# Patient Record
Sex: Male | Born: 1980 | Race: Black or African American | Hispanic: No | Marital: Single | Smoking: Current every day smoker
Health system: Southern US, Community
[De-identification: ages and names within clinical notes are randomized; demographics above are authoritative.]

## PROBLEM LIST (undated history)

## (undated) DIAGNOSIS — G43909 Migraine, unspecified, not intractable, without status migrainosus: Secondary | ICD-10-CM

## (undated) DIAGNOSIS — J939 Pneumothorax, unspecified: Secondary | ICD-10-CM

---

## 2005-10-31 ENCOUNTER — Emergency Department (HOSPITAL_COMMUNITY): Admission: EM | Admit: 2005-10-31 | Discharge: 2005-11-01 | Payer: Self-pay | Admitting: Emergency Medicine

## 2007-02-07 ENCOUNTER — Emergency Department (HOSPITAL_COMMUNITY): Admission: EM | Admit: 2007-02-07 | Discharge: 2007-02-07 | Payer: Self-pay | Admitting: Emergency Medicine

## 2008-05-20 ENCOUNTER — Inpatient Hospital Stay (HOSPITAL_COMMUNITY): Admission: EM | Admit: 2008-05-20 | Discharge: 2008-05-29 | Payer: Self-pay | Admitting: Emergency Medicine

## 2008-05-23 ENCOUNTER — Ambulatory Visit: Payer: Self-pay | Admitting: Thoracic Surgery

## 2008-05-26 ENCOUNTER — Encounter: Payer: Self-pay | Admitting: Thoracic Surgery

## 2008-06-04 ENCOUNTER — Ambulatory Visit: Payer: Self-pay | Admitting: Thoracic Surgery

## 2008-06-04 ENCOUNTER — Encounter: Admission: RE | Admit: 2008-06-04 | Discharge: 2008-06-04 | Payer: Self-pay | Admitting: Thoracic Surgery

## 2008-06-25 ENCOUNTER — Ambulatory Visit: Payer: Self-pay | Admitting: Thoracic Surgery

## 2008-06-25 ENCOUNTER — Encounter: Admission: RE | Admit: 2008-06-25 | Discharge: 2008-06-25 | Payer: Self-pay | Admitting: Thoracic Surgery

## 2008-09-21 ENCOUNTER — Emergency Department (HOSPITAL_COMMUNITY): Admission: EM | Admit: 2008-09-21 | Discharge: 2008-09-21 | Payer: Self-pay | Admitting: Emergency Medicine

## 2009-07-24 IMAGING — CR DG CHEST 1V PORT
1 series · 1 of 1 positions shown · non-contrast
Comparison: 05/26/2008

CLINICAL DATA: Evaluate right pneumothorax after chest tube
placement

PORTABLE CHEST - 1 VIEW

[view not recorded]
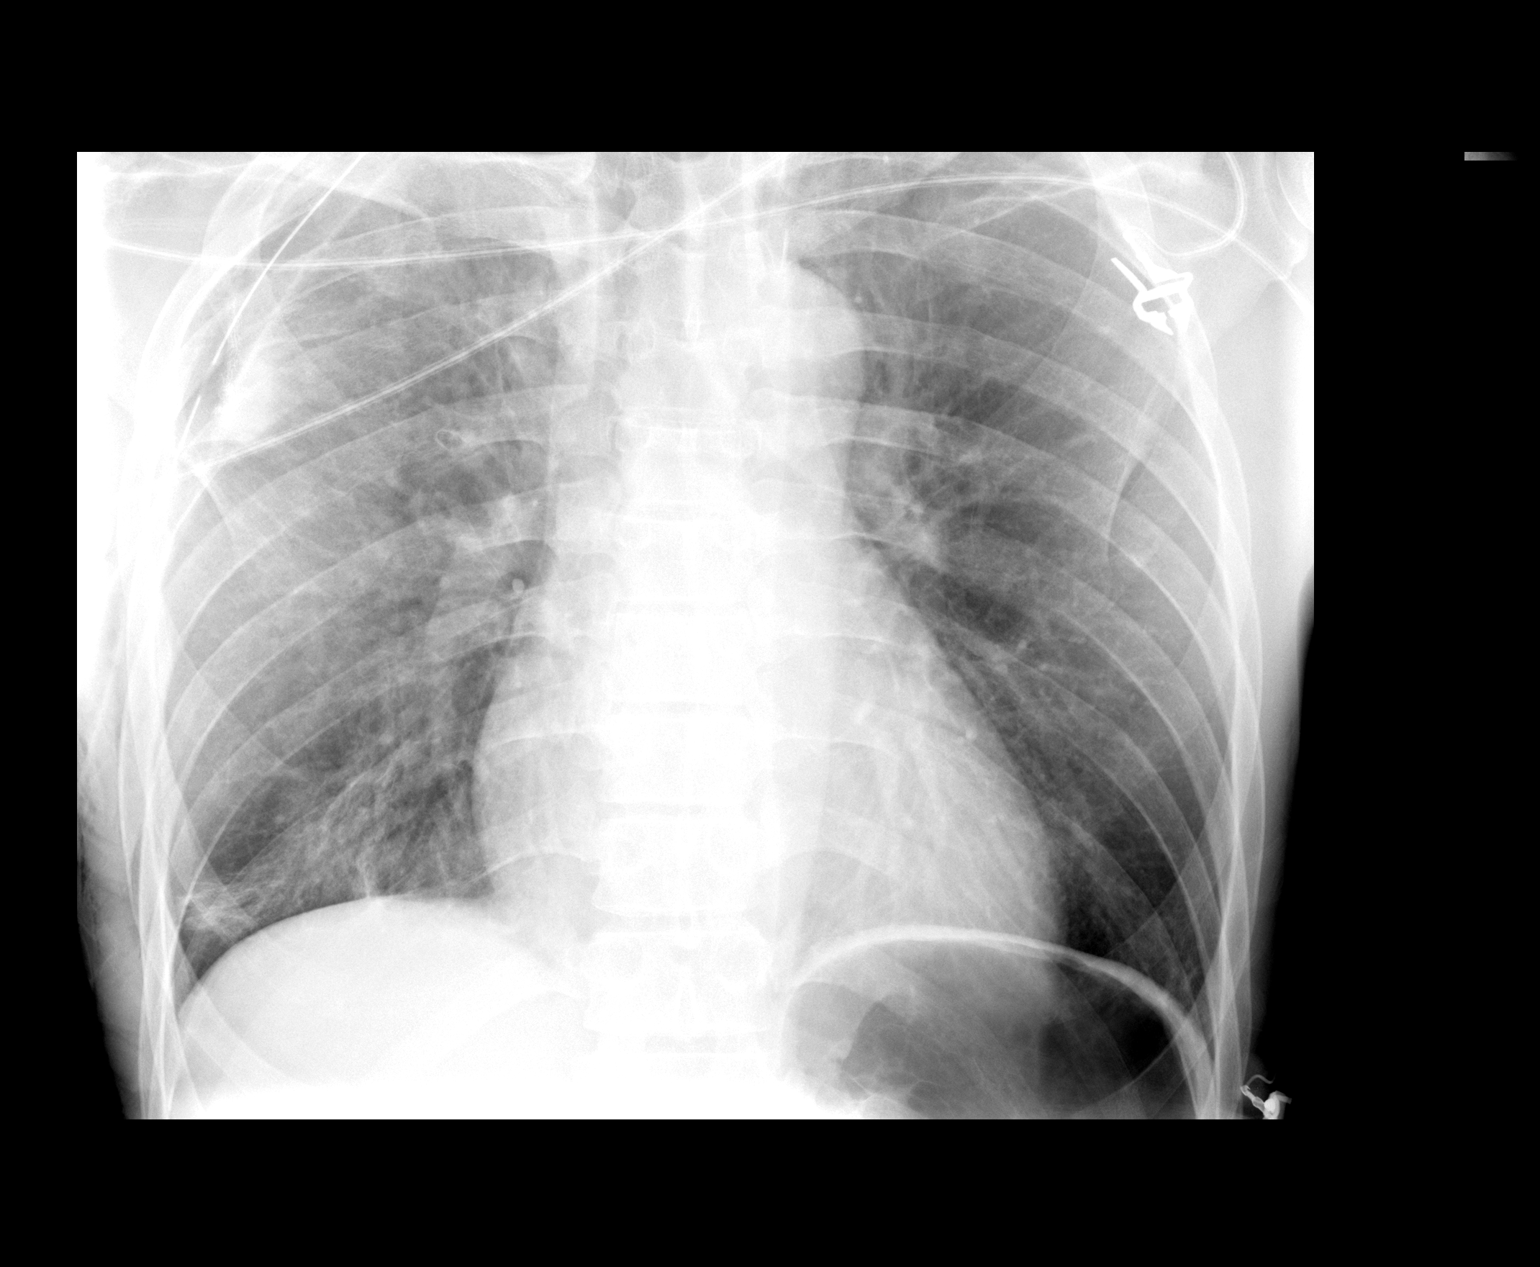

[1 of 1 positions shown; findings below may reference images not displayed]

FINDINGS: There is an significant improvement in the right
pneumothorax after chest tube placement.

Right pneumothorax now measures approximately 20%.

Heart size is normal.

There is no pleural effusion.

Atelectasis is identified at the right lung base..
IMPRESSION: 1.  Significant re-expansion of right lung.  Approximate 20% right
upper lobe pneumothorax remains after chest tube placement

## 2009-07-25 IMAGING — CR DG CHEST 1V PORT
1 series · 1 of 1 positions shown · non-contrast
Comparison: 05/26/2008

CLINICAL DATA: Postoperative status post video assisted
thoracoscopic surgery, pneumothorax

PORTABLE CHEST - 1 VIEW

[view not recorded]
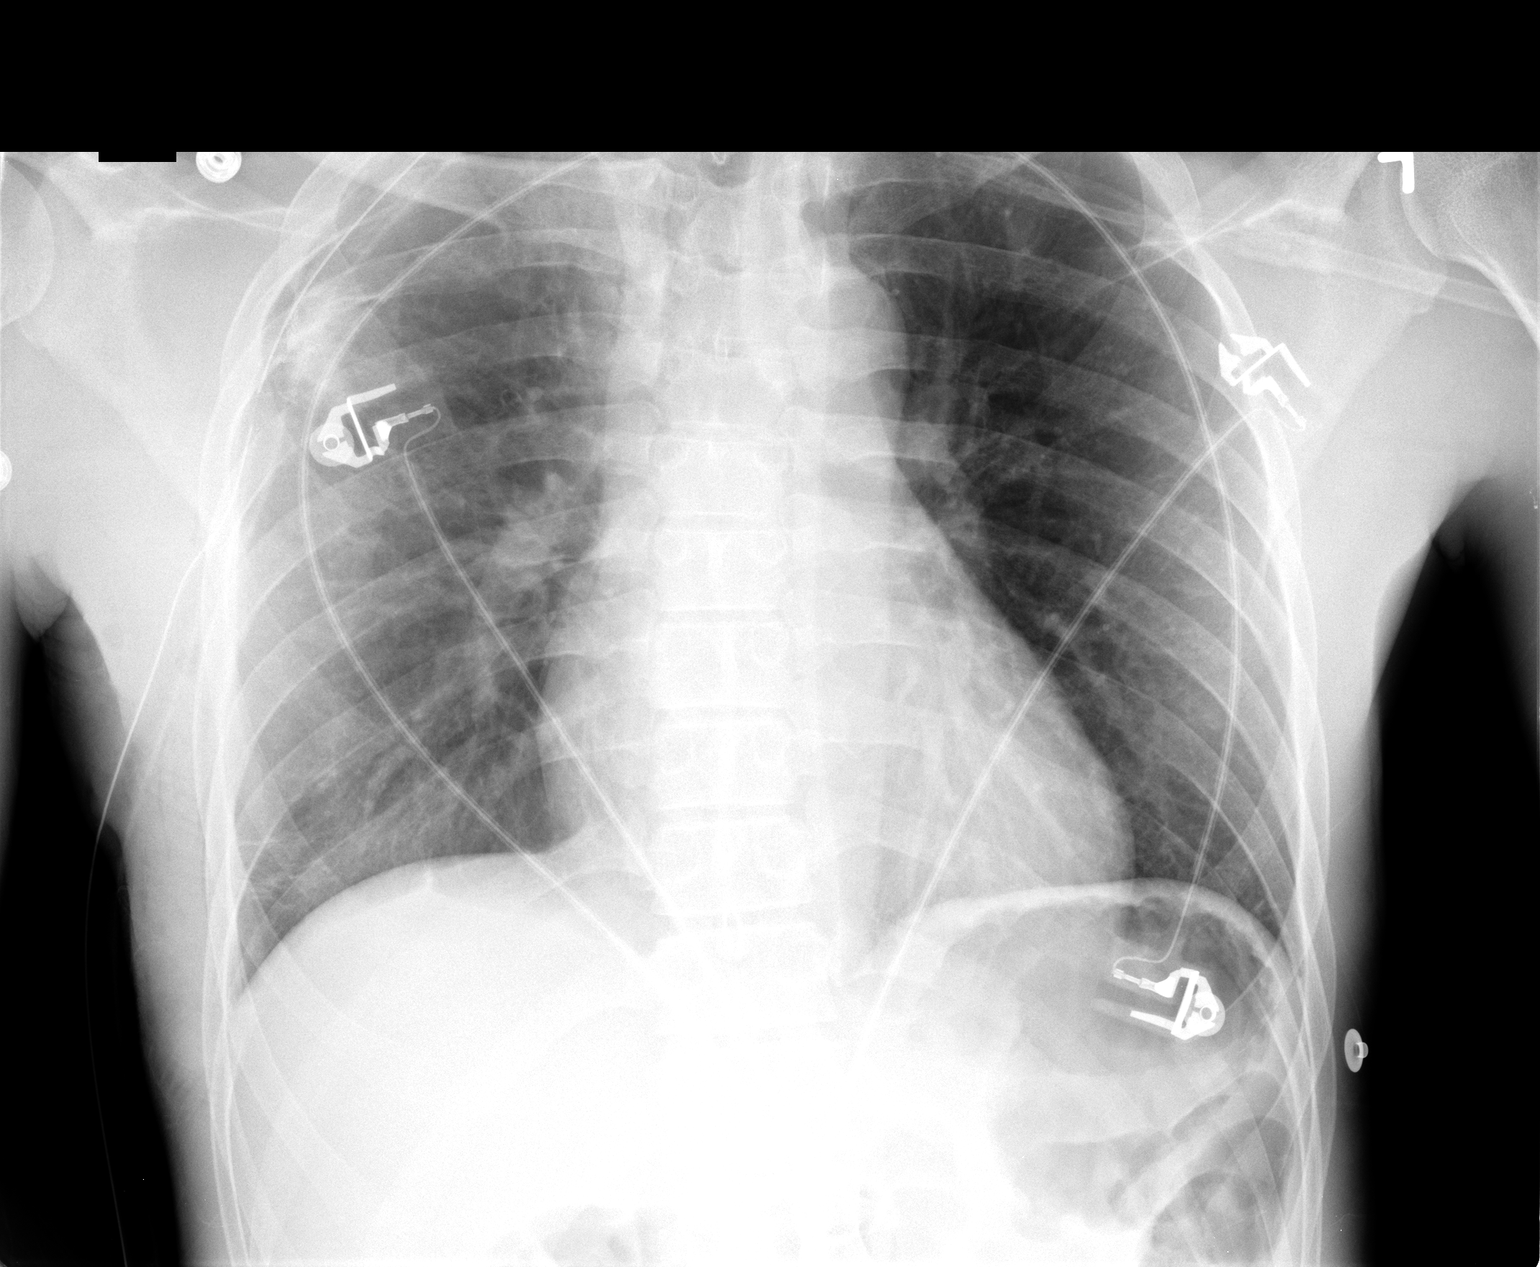

[1 of 1 positions shown; findings below may reference images not displayed]

FINDINGS: Near complete resolution of now trace right apical
pneumothorax noted with right-sided chest tube remaining in place.
Right apical sutures noted.  No mediastinal shift.  Heart size
normal.  Cardiac leads overlie the chest.
IMPRESSION: Continued decrease in now trace right apical pneumothorax with
chest tube in place.

## 2009-07-27 IMAGING — CR DG CHEST 2V
2 series · 2 of 2 positions shown · non-contrast
Comparison: Portable chest of 05/28/2008

CLINICAL DATA: Lung surgery, chest soreness, follow-up

CHEST - 2 VIEW

[w chest pa]
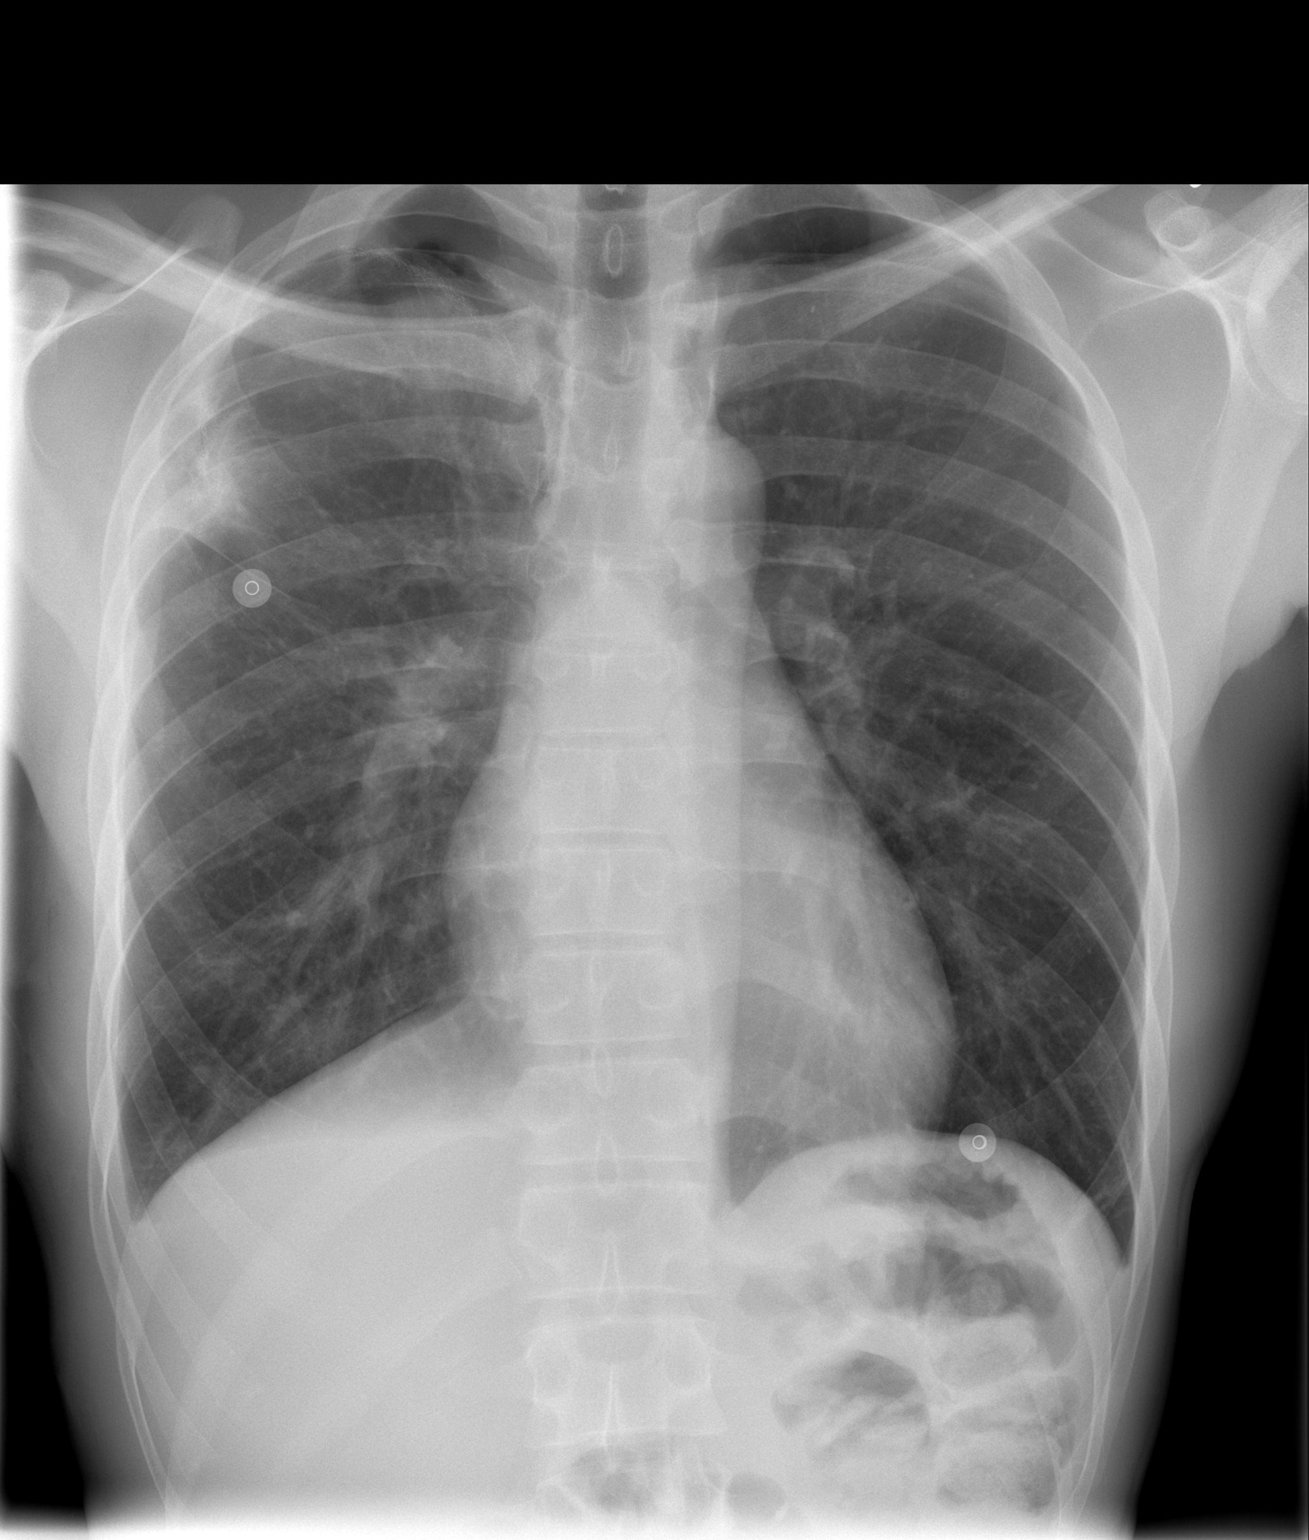

[w chest lat]
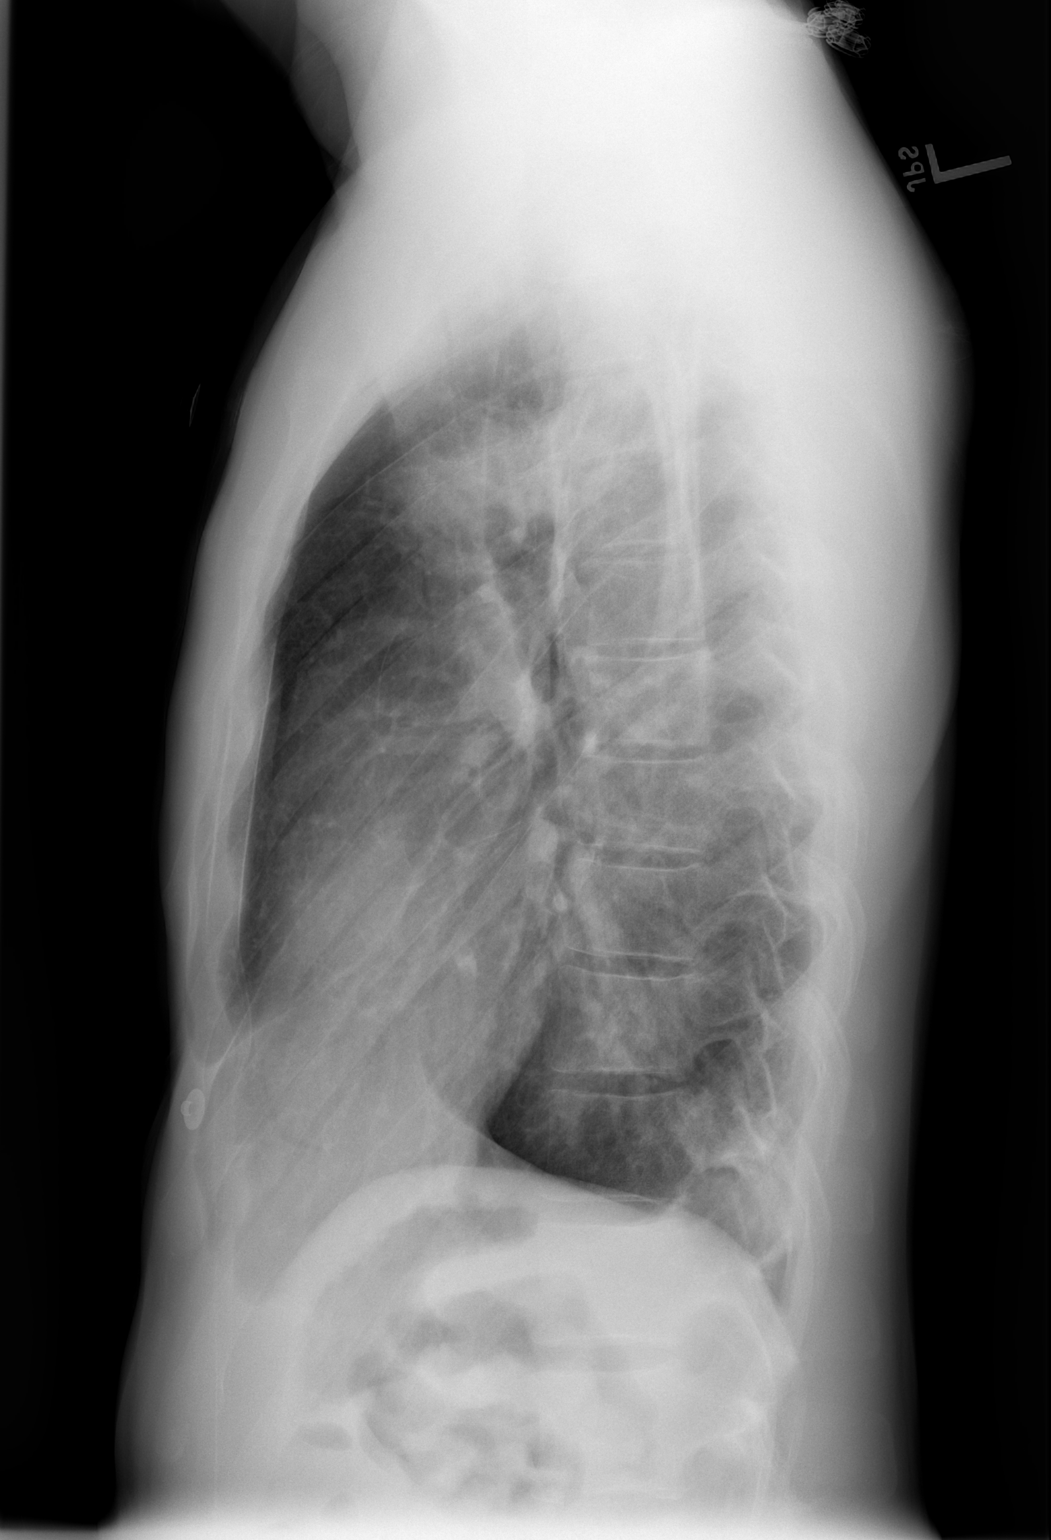

[2 of 2 positions shown; findings below may reference images not displayed]

FINDINGS: An there is little change in the small right apical
pneumothorax with pleural sutures noted in the right lung apex and
in the periphery of the right upper lobe.  Otherwise the lungs are
clear.  There does appear to be a small right pleural effusion
present.  Heart size is stable.  No bony abnormality is seen.
IMPRESSION: Stable small right apical pneumothorax with postop change in the
right upper lobe.  Small right pleural effusion.

## 2010-08-29 ENCOUNTER — Encounter: Payer: Self-pay | Admitting: Thoracic Surgery

## 2010-12-21 NOTE — Op Note (Signed)
NAME:  Phillip, [name] NO.:  [id number]   MEDICAL RECORD NO.:  [id number]          PATIENT TYPE:  INP   LOCATION:  3304                         FACILITY:  [facility]   PHYSICIAN:  Ines Bloomer, M.D. DATE OF BIRTH:  [birth year]   DATE OF PROCEDURE:  05/26/2008  DATE OF DISCHARGE:  05/29/2008                               OPERATIVE REPORT   PREOPERATIVE DIAGNOSIS:  Right pneumothorax secondary to apical blebs  with persistent air leak.   POSTOPERATIVE DIAGNOSIS:  Right pneumothorax secondary to apical blebs  with persistent air leak.   OPERATION PERFORMED:  Right video-assisted thoracoscopic surgery,  resection of apical blebs for likely pleurodesis.   SURGEON:  Ines Bloomer, MD   FIRST ASSISTANT:  Theda Belfast, PA-C   ANESTHESIA:  General anesthesia.   PROCEDURE:  After percutaneous insertion of all monitor lines, the  patient underwent general anesthesia, was turned to the right lateral  thoracotomy position, prepped and draped in the usual sterile manner.  Dual-lumen tube was inserted.  The right lung was deflated.  Two trocar  sites were made in the anterior and posterior axillary lines at the  fifth intercostal space, two trocars were inserted.  Trocar site was  made in the mid axillary line at the third intercostal space.  The  pictures were taken and we could see apical blebs, very large apical  blebs at the top of the apex.  There was a small bleb on top of the  apex.  Using this, we came in with a Kaiser ring forceps from the  superior trocar site and grasped the blebs and then coming in from the  posterior trocar site using a Covidien 45-mm green stapler.  We resected  the bleb with 2 applications, then we reexamined that there was another  smaller one more anteriorly and this was grasped in like manner, was  resected and removed, as well as blebs were removed.  We checked for air  leak and none was found.  We then removed the pleura by  using the [facility]  ring forceps over the first 5 rib strip in the pleura and then using a  Bovie scraper to causing mechanical pleurodesis of the lower ribs down  to the diaphragm and 1 chest tube was brought into the anterior trocar  site, the other 2 trocar site were closed with 3-0 Vicryl and Dermabond  for the skin.  The lung was reexpanded.  A Marcaine block was then usual  fashion with single On-Q was inserted in the usual fashion.  The chest  tube was sutured in place with a 0 silk.      Ines Bloomer, M.D.  Electronically Signed     DPB/MEDQ  D:  06/28/2008  T:  06/28/2008  Job:  [id number]

## 2010-12-21 NOTE — H&P (Signed)
NAME:  Phillip [name], Phillip [name] NO.:  [id number]   MEDICAL RECORD NO.:  [id number]          PATIENT TYPE:  EMS   LOCATION:  ED                            FACILITY:  [facility]   PHYSICIAN:  Dalia Heading, M.D.  DATE OF BIRTH:  1981/06/07   DATE OF ADMISSION:  05/20/2008  DATE OF DISCHARGE:  LH                              HISTORY & PHYSICAL   CHIEF COMPLAINT:  Spontaneous right pneumothorax.   HISTORY OF PRESENT ILLNESS:  The patient is a 30 year old black male who  presented to the emergency room with 3-4 hour history of worsening right-  sided chest pain and shortness of breath.  Chest x-ray was performed  which revealed a complete right pneumothorax.  Surgery consultation was  obtained and the patient was evaluated in the emergency room.   PAST MEDICAL HISTORY:  Unremarkable.   PAST SURGICAL HISTORY:  Unremarkable.   CURRENT MEDICATIONS:  None.   ALLERGIES:  No known drug allergies.   REVIEW OF SYSTEMS:  The patient does smoke tobacco.  Denies any history  of having trauma or pneumothorax in the past.   PHYSICAL EXAMINATION:  GENERAL:  The patient is a pleasant black male in  no acute distress.  He is afebrile.  VITAL SIGNS:  Stable.  LUNGS:  Decreased breath sounds are heard on the right.  He is clear to  auscultation on the left side.  There does not appear to be a  significant tracheal deviation present.  HEART:  Reveals regular rate and rhythm without S3, S4, or murmurs.  ABDOMEN:  Unremarkable.   PROCEDURE NOTE:  The patient was placed left lateral decubitus position.  A informed consent was obtained from the patient.  Surgical site  confirmation was performed.  The right mid axillary line was prepped and  draped using the usual sterile technique with ChloraPrep, 1% Xylocaine  was used for local anesthesia.  A small-caliber Heimlich chest tube was  then placed at approximately the fifth intercostal space without  difficulty.  This was connected to a  __________ at negative 20-cm  suction.  A follow up chest x-ray was performed which revealed complete  resolution of the pneumothorax and adequate position of the chest tube.  The patient tolerated the procedure well.  His oxygen saturation at the  end of the procedure was 100%.   IMPRESSION:  Spontaneous right pneumothorax.   PLAN:  The patient be admitted to hospital for further management of his  chest tube.  He has been made aware that he will be in the hospital for  approximately 2-3 days.  There is a small air leak present, thus this  precludes him being discharged home.      Dalia Heading, M.D.  Electronically Signed     MAJ/MEDQ  D:  05/20/2008  T:  05/21/2008  Job:  [id number]

## 2010-12-21 NOTE — Assessment & Plan Note (Signed)
OFFICE VISIT   SHENOUDA, GENOVA  DOB:  Jun 14, 1981                                        June 04, 2008  CHART #:  [id number]   HISTORY OF PRESENTING ILLNESS:  This is a 30 year old African American  male who originally presented to [facility] with a 3 or 4-hour  history of worsening right-sided chest pain and shortness of breath.  A  chest x-ray done showed a complete spontaenous right pneumothorax.  A  chest tube was placed with full resolution of right pneumothorax.  He  was then admitted to [facility] for further chest tube management;  however he developed an air leak and the subsequent 5%-10% right apical  pneumothorax.  He was transferred from [facility] to [facility] where he  underwent a right VATS, apical bleb resection of the right upper lobe  and pleurectomy by Dr. Edwyna Shell on May 26, 2008.  Since having been  discharged from the hospital on May 29, 2008, he notes some numbness  on the right side in right anterior chest and occasional fatigue.   PHYSICAL EXAMINATION:  GENERAL:  This is a pleasant 30 year old [place]  American male who is in no acute distress.  He is alert and cooperative.  VITAL SIGNS:  Blood pressure 131/81, heart rate 97, respiratory rate 18,  and O2 sat 99% on room air.  CARDIOVASCULAR:  Regular rate and rhythm.  LUNGS:  Clear to auscultation bilaterally.  No rales, wheezes, or  rhonchi.  EXTREMITIES:  No cyanosis, clubbing, or edema.   Right chest wound clean and dry, 2 sutures removed, couple of Steri-  Strips placed.  On chest x-ray Strips placed.   Chest x-ray done today showed tiny right apical pneumothorax somewhat  decreased from previous chest x-ray on May 29, 2008, also some  atelectasis on the right, left lung essentially clear.   IMPRESSION AND PLAN:  Overall, surgically stable, status post right  video-assisted thoracoscopic surgery, right upper lobe resection of  apical blebs on  May 26, 2008.  The patient was instructed, he may  drive.  The patient is going to be seen by Dr. Edwyna Shell with a chest x-ray  in 3 weeks.  He may then return to work at that time.  The patient was  then instructed to remove the Steri-Strips in 2-3 days.  He was  instructed to call if any fever, chills, erythema, or drainage develops.   Ines Bloomer, M.D.  Electronically Signed   DZ/MEDQ  D:  06/04/2008  T:  06/05/2008  Job:  [id number]

## 2010-12-21 NOTE — Discharge Summary (Signed)
NAME:  IDEN, STRIPLING NO.:  [id number]   MEDICAL RECORD NO.:  [id number]          PATIENT TYPE:  INP   LOCATION:  3304                         FACILITY:  [facility]   PHYSICIAN:  Ines Bloomer, M.D. DATE OF BIRTH:  October 23, 1980   DATE OF ADMISSION:  05/23/2008  DATE OF DISCHARGE:  05/29/2008                               DISCHARGE SUMMARY   ADMITTING DIAGNOSIS:  Spontaneous right pneumothorax with persistent air  leak.   DISCHARGE DIAGNOSIS:  Spontaneous right pneumothorax with persistent air  leak.   PROCEDURES:  1. Placement of a right chest tube (at [facility]).  2. Right VATS, bleb resection, right upper lobe resection x2 by Dr.      Edwyna Shell on May 26, 2008.   PATHOLOGY:  1. Right upper lobe wedge resection, focal subpleural fibrosis and      emphysematous changes with foci of a small bleb.  2. Wedge resection of the right upper lobe, focal subpleural fibrosis      and emphysematous changes with bleb formation.  3. Wedge resection, same as above in addition to focus consistent with      bronchiectasis.  Pleural biopsy consistent with mild pleuritis.  No      atypia or evidence of malignancy.   HISTORY OF PRESENT ILLNESS:  This is a 30 year old African American  male, who originally presented to the emergency room at [facility] with a 3-4 hours history of worsening right-sided chest pain  and shortness of breath.  A chest x-ray done revealed a complete  spontaneous right pneumothorax.  A chest tube was placed, there was full  resolution of the right pneumothorax.  He was then admitted to [facility] for further management of the chest tube.  Over the next several  days, he was noted to have an air leak from the chest tube and he also  developed an approximately 5%-10% right apical pneumothorax.  He was  then transferred from [facility] to [facility] for further  evaluation and treatment by Dr. Edwyna Shell.   BRIEF HOSPITAL COURSE STAY:   The patient's chest tube remained on  suction.  He was noted to have a 1-2/7 air leak.  Chest x-ray done on  May 26, 2008, revealed a 100% right pneumothorax.  The patient was  in no respiratory distress.  It was decided, however, he needed to  undergo surgical intervention.  He underwent a right VAT, bleb  resection, and right upper lobe wedge resection x2 by Dr. Edwyna Shell on  May 26, 2008.  Postoperatively, the patient was noted again to have  a small air leak.  He also had a right apical pneumothorax that  gradually became smaller and remained relatively stable.  He was also no  longer noted to have an air leak.  His chest tube was removed.  On  May 28, 2008, followup chest x-ray revealed a small right apical  pneumothorax (probably less than 5%).  The patient remained afebrile and  hemodynamically stable.  Currently on postop day #3, he has no  complaints.   PHYSICAL EXAMINATION:  CARDIOVASCULAR:  Regular rate and rhythm.  LUNGS:  Clear to auscultation bilaterally.  CHEST:  Right posterior chest incisions are clean and dry.   Chest x-ray done today again revealed a stable small right apical  pneumothorax with postoperative changes in the right upper lobe and a  small right pleural effusion.  Per Dr. Edwyna Shell, the patient is going to  be discharged home today.   Latest laboratory studies are as follows:  Last CMP done May 28, 2008, potassium 3.9, BUN and creatinine 4 and 8.4 respectively.  Last  CBC also done on this date revealed H and H to be 12.7 and 38.2, white  blood cell count of 9700, and platelet count of 96,000.  Last chest x-  ray results already discussed previously.   DISCHARGE INSTRUCTION:  The patient is to remain on a regular diet.  He  is instructed that he may shower.  He is not to lift or drive for 2  weeks.  He is to continue with his breathing exercises daily, to walk  every day.  The patient has a followup to see Dr. Edwyna Shell on June 04, 2008,  at 12:45 p.m.  Prior to this office appointment, chest x-ray will  be obtained.  Chest tube sutures will be removed at this office  appointment.  The patient was instructed he may use soap and water, then  let his right posterior chest wounds open to the air.   DISCHARGE MEDICATIONS:  1. One A Day for Men p.o. daily.  2. NicoDerm CQ 20 mg for 24-hour, remove old patch before applying new      one.  3. Percocet 5/325 mg 1-2 tablets p.o. q.4-6 h. as needed for pain.      Doree Fudge, [place]      Ines Bloomer, M.D.  Electronically Signed    DZ/MEDQ  D:  05/29/2008  T:  05/30/2008  Job:  [id number]   cc:   Dalia Heading, M.D.

## 2010-12-21 NOTE — Discharge Summary (Signed)
NAME:  [name], POTE NO.:  [id number]   MEDICAL RECORD NO.:  [id number]          PATIENT TYPE:  INP   LOCATION:  A338                          FACILITY:  [facility]   PHYSICIAN:  Dalia Heading, M.D.  DATE OF BIRTH:  16-Jul-1981   DATE OF ADMISSION:  05/20/2008  DATE OF DISCHARGE:  10/16/2009LH                               DISCHARGE SUMMARY   HOSPITAL COURSE SUMMARY:  The patient is a 30 year old black male who  presented to the emergency room of [facility] with a spontaneous  complete right pneumothorax.  General surgery was consulted, and a small  bore right chest tube was placed.  There was full resolution of the  pneumothorax on postprocedure films.  The patient was admitted to  hospital for further management of this chest tube.  Over the next few  days, he has continued to have an air leak.  He has also developed an  approximately 5-10% residual apical right pneumothorax.  As he has a  ongoing air leak, Dr. Edwyna Shell of thoracic surgery has been consulted.  He  has accepted the patient on transfer for possible surgical repair of the  pneumothorax which can not be done at this facility.  The patient has  been made aware of this and agrees to the transfer.   Of note was the fact that the patient is a smoker.  This is his first  episode and has never had an episode of pneumothorax before.   The patient will be transferred down to [facility] once a  bed is available.   DISCHARGE MEDICATIONS:  Include:  1. Vicodin 1-2 tablets p.o. q.4 h. p.r.n. pain.  2. O2 2 liters nasal cannula.  3. Dilaudid 1 mg IV q.3 h. p.r.n. pain.  4. Lovenox 40 mg subcutaneous daily.  5. Nicoderm CG 21 mg patch to skin daily.   PRINCIPAL DIAGNOSIS:  Right pneumothorax.   PROCEDURE:  Chest tube placement on May 20, 2008.      Dalia Heading, M.D.  Electronically Signed     MAJ/MEDQ  D:  05/23/2008  T:  05/23/2008  Job:  [id number]

## 2010-12-21 NOTE — Assessment & Plan Note (Signed)
OFFICE VISIT   Phillip [name], Phillip [name]  DOB:  Oct 15, 1980                                        June 25, 2008  CHART #:  [id number]   HISTORY:  The patient is a 30 year old black male who originally  presented to the [facility] with a 3 or 4 hour history of  worsening right-sided chest pain and shortness of breath.  Chest x-ray  at that time revealed a complete spontaenous right-sided pneumothorax.  He was initially treated with a chest tube, but subsequently did develop  apical pneumothorax and prolonged air leak.  He was transferred to [facility] for a surgical intervention.  Dr. Edwyna Shell performed a right  video-assisted thoracoscopy with an apical bleb resection and  pleurectomy on May 26, 2008.  He returns on today's date for routine  followup.   CHEST X-RAY:  Chest x-ray was obtained on today's date.  He has only a  trace apical pneumothorax and x-ray has improved from previous study.   PHYSICAL EXAMINATION:  VITAL SIGNS:  Blood pressure is 127/86, pulse is  100, respirations 18, and oxygen saturation is 100% on room air.  GENERAL APPEARANCE:  A well-developed, thin, black male in no acute  distress.  PULMONARY:  Clear breath sounds throughout.  CARDIAC:  Regular rate and rhythm.  Incisions are well healed without  evidence of infection.   ASSESSMENT:  The patient continues to make excellent ongoing progress in  his recovery.  He is currently asymptomatic.  Dr. Edwyna Shell informed him  that he can go back to work on June 30, 2008.  Additionally, he can  be followed up in the office now on a p.r.n. basis.   Rowe Clack, P.A.-C.   Sherryll Burger  D:  06/25/2008  T:  06/26/2008  Job:  [id number]   cc:   Ines Bloomer, M.D.

## 2011-05-10 LAB — PROTIME-INR: INR: 1.1

## 2011-05-10 LAB — BASIC METABOLIC PANEL
BUN: 3 — ABNORMAL LOW
BUN: 9
CO2: 30
Chloride: 100
Chloride: 101
Chloride: 96
Creatinine, Ser: 0.71
Creatinine, Ser: 0.84
GFR calc Af Amer: >60
Glucose, Bld: 107 — ABNORMAL HIGH
Glucose, Bld: 88
Potassium: 4.9
Sodium: 130 — ABNORMAL LOW
Sodium: 139

## 2011-05-10 LAB — URINALYSIS, ROUTINE W REFLEX MICROSCOPIC
Glucose, UA: NEGATIVE
Nitrite: NEGATIVE
pH: 6.5

## 2011-05-10 LAB — CBC
HCT: 38.2 — ABNORMAL LOW
HCT: 44.7
Hemoglobin: 13.4
Hemoglobin: 14.8
MCHC: 33.1
MCHC: 33.3
MCV: 88.7
MCV: 89.4
Platelets: 196
Platelets: 233
Platelets: 251
RBC: 4.51
RBC: 4.59
RBC: 5
RDW: 13.7
RDW: 14.1
WBC: 13.6 — ABNORMAL HIGH
WBC: 9.4

## 2011-05-10 LAB — BLOOD GAS, ARTERIAL
O2 Content: 1
O2 Saturation: 97.6

## 2011-05-10 LAB — TYPE AND SCREEN

## 2011-05-10 LAB — COMPREHENSIVE METABOLIC PANEL
ALT: 15
AST: 16
Albumin: 3.7
Alkaline Phosphatase: 55
Calcium: 9.7
Chloride: 100
Creatinine, Ser: 0.84
Potassium: 3.9
Sodium: 139

## 2011-05-10 LAB — ABO/RH: ABO/RH(D): O POS

## 2011-05-10 LAB — APTT: aPTT: 31

## 2015-04-15 ENCOUNTER — Encounter (HOSPITAL_COMMUNITY): Payer: Self-pay | Admitting: *Deleted

## 2015-04-15 ENCOUNTER — Emergency Department (HOSPITAL_COMMUNITY)
Admission: EM | Admit: 2015-04-15 | Discharge: 2015-04-15 | Disposition: A | Discharge status: Home / Self Care | Payer: Self-pay | Attending: Emergency Medicine | Admitting: Emergency Medicine

## 2015-04-15 DIAGNOSIS — Z8709 Personal history of other diseases of the respiratory system: Secondary | ICD-10-CM | POA: Insufficient documentation

## 2015-04-15 DIAGNOSIS — G43909 Migraine, unspecified, not intractable, without status migrainosus: Secondary | ICD-10-CM | POA: Insufficient documentation

## 2015-04-15 DIAGNOSIS — Z720 Tobacco use: Secondary | ICD-10-CM | POA: Insufficient documentation

## 2015-04-15 HISTORY — DX: Pneumothorax, unspecified: J93.9

## 2015-04-15 HISTORY — DX: Migraine, unspecified, not intractable, without status migrainosus: G43.909

## 2015-04-15 MED ORDER — DIPHENHYDRAMINE HCL 25 MG PO CAPS
50.0000 mg | ORAL_CAPSULE | Freq: Once | ORAL | Status: AC
  Administered 2015-04-15: 50 mg via ORAL
  Filled 2015-04-15: qty 2

## 2015-04-15 MED ORDER — KETOROLAC TROMETHAMINE 60 MG/2ML IM SOLN
60.0000 mg | Freq: Once | INTRAMUSCULAR | Status: AC
  Administered 2015-04-15: 60 mg via INTRAMUSCULAR
  Filled 2015-04-15: qty 2

## 2015-04-15 MED ORDER — METOCLOPRAMIDE HCL 5 MG/ML IJ SOLN
10.0000 mg | Freq: Once | INTRAMUSCULAR | Status: AC
  Administered 2015-04-15: 10 mg via INTRAMUSCULAR
  Filled 2015-04-15: qty 2

## 2015-04-15 NOTE — ED Provider Notes (Signed)
CSN: [id number]     Arrival date & time 04/15/15  7829 History   First MD Initiated Contact with Patient 04/15/15 [id number]     No chief complaint on file.    (Consider location/radiation/quality/duration/timing/severity/associated sxs/prior Treatment) HPI   Phillip [name] is a 34 y.o. male who presents to the Emergency Department complaining of gradual onset of frontal headache.  He states the symptoms began yesterday as a headache behind his eyes and has continued to progress.  He describes a throbbing, pounding sensation behind his eyes and to his forehead and sensitivity to light.  He states he suffers from migraine headaches and states this headache is similar to previous.  He has tried OTC pain relievers without improvement.  He notes some nausea yesterday, but denies vomiting, fever, neck pain or stiffness, rash, numbness, weakness or dizziness.     Past Medical History  Diagnosis Date  . Migraines   . Pneumothorax    History reviewed. No pertinent past surgical history. No family history on file. Social History  Substance Use Topics  . Smoking status: Current Every Day Smoker    Types: Cigarettes  . Smokeless tobacco: None  . Alcohol Use: No    Review of Systems  Constitutional: Negative for fever, activity change and appetite change.  HENT: Negative for facial swelling and trouble swallowing.   Eyes: Positive for photophobia. Negative for pain and visual disturbance.  Respiratory: Negative for shortness of breath.   Cardiovascular: Negative for chest pain.  Gastrointestinal: Negative for nausea and vomiting.  Musculoskeletal: Negative for neck pain and neck stiffness.  Skin: Negative for rash and wound.  Neurological: Positive for headaches. Negative for dizziness, facial asymmetry, speech difficulty, weakness and numbness.  Psychiatric/Behavioral: Negative for confusion and decreased concentration.  All other systems reviewed and are negative.     Allergies   Review of patient's allergies indicates no known allergies.  Home Medications   Prior to Admission medications   Not on File   BP 154/100 mmHg  Pulse 91  Temp(Src) 98.3 F (36.8 C) (Oral)  Resp 16  Ht 6' (1.829 m)  Wt 157 lb (71.215 kg)  BMI 21.29 kg/m2  SpO2 100%   Physical Exam  Constitutional: He is oriented to person, place, and time. He appears well-developed and well-nourished. No distress.  HENT:  Head: Normocephalic and atraumatic.  Mouth/Throat: Oropharynx is clear and moist.  Eyes: EOM are normal. Pupils are equal, round, and reactive to light.  Neck: Normal range of motion and phonation normal. Neck supple. No spinous process tenderness and no muscular tenderness present. No rigidity. No Brudzinski's sign and no Kernig's sign noted.  Cardiovascular: Normal rate, regular rhythm, normal heart sounds and intact distal pulses.   No murmur heard. Pulmonary/Chest: Effort normal and breath sounds normal. No respiratory distress.  Musculoskeletal: Normal range of motion.  Neurological: He is alert and oriented to person, place, and time. He has normal strength. No cranial nerve deficit or sensory deficit. He exhibits normal muscle tone. Coordination and gait normal. GCS eye subscore is 4. GCS verbal subscore is 5. GCS motor subscore is 6.  Reflex Scores:      Tricep reflexes are 2+ on the right side and 2+ on the left side.      Bicep reflexes are 2+ on the right side and 2+ on the left side. Skin: Skin is warm and dry.  Psychiatric: He has a normal mood and affect.  Nursing note and vitals reviewed.   ED Course  Procedures (including critical care time)   MDM   Final diagnoses:  Migraine without status migrainosus, not intractable, unspecified migraine type    Pt is recurrent headaches, state this headache is typical of previous.  No relief at home with OTC analgesics.   Will tx with toradol, benadryl and reglan.    1610  Patient is feeling much better and  states he is ready for discharge.  Agrees to close PMD f/u if needed.      Pauline Aus, PA-C 04/15/15 1001  Gilda Crease, MD 04/15/15 1526

## 2015-04-15 NOTE — Discharge Instructions (Signed)

## 2015-04-15 NOTE — ED Notes (Signed)
Pt states migraine started yesterday. States nausea yesterday. Only symptom other than pain today is sensitivity to light. States he has take goody powders and Tylenol with no relief. Hx of migraines in the past, per pt.

## 2018-05-09 ENCOUNTER — Emergency Department (HOSPITAL_COMMUNITY)
Admission: EM | Admit: 2018-05-09 | Discharge: 2018-05-09 | Disposition: A | Discharge status: Home / Self Care | Payer: BLUE CROSS/BLUE SHIELD | Attending: Emergency Medicine | Admitting: Emergency Medicine

## 2018-05-09 ENCOUNTER — Other Ambulatory Visit: Payer: Self-pay

## 2018-05-09 ENCOUNTER — Encounter (HOSPITAL_COMMUNITY): Payer: Self-pay

## 2018-05-09 DIAGNOSIS — G43809 Other migraine, not intractable, without status migrainosus: Secondary | ICD-10-CM | POA: Insufficient documentation

## 2018-05-09 DIAGNOSIS — R51 Headache: Secondary | ICD-10-CM | POA: Diagnosis present

## 2018-05-09 DIAGNOSIS — F17200 Nicotine dependence, unspecified, uncomplicated: Secondary | ICD-10-CM | POA: Insufficient documentation

## 2018-05-09 MED ORDER — DEXAMETHASONE SODIUM PHOSPHATE 4 MG/ML IJ SOLN
8.0000 mg | Freq: Once | INTRAMUSCULAR | Status: AC
  Administered 2018-05-09: 8 mg via INTRAVENOUS
  Filled 2018-05-09: qty 2

## 2018-05-09 MED ORDER — SODIUM CHLORIDE 0.9 % IV BOLUS
500.0000 mL | Freq: Once | INTRAVENOUS | Status: AC
  Administered 2018-05-09: 500 mL via INTRAVENOUS

## 2018-05-09 MED ORDER — IBUPROFEN 800 MG PO TABS: 800.0000 mg | ORAL_TABLET | Freq: Three times a day (TID) | ORAL | 0 refills | Status: DC | PRN

## 2018-05-09 MED ORDER — KETOROLAC TROMETHAMINE 30 MG/ML IJ SOLN
30.0000 mg | Freq: Once | INTRAMUSCULAR | Status: AC
  Administered 2018-05-09: 30 mg via INTRAVENOUS
  Filled 2018-05-09: qty 1

## 2018-05-09 MED ORDER — METOCLOPRAMIDE HCL 5 MG/ML IJ SOLN
10.0000 mg | Freq: Once | INTRAMUSCULAR | Status: AC
  Administered 2018-05-09: 10 mg via INTRAVENOUS
  Filled 2018-05-09: qty 2

## 2018-05-09 NOTE — ED Triage Notes (Signed)
Pt states worsening migraine that he has had for 2x days, hx of migraines. Also states pain in right wrist for approx a week, would like to get it checked out. Denies any injury.

## 2018-05-09 NOTE — Discharge Instructions (Signed)

## 2018-05-09 NOTE — ED Notes (Signed)
Awaiting fluids to finish before d/c

## 2018-05-09 NOTE — ED Provider Notes (Signed)
Emergency Department Provider Note   I have reviewed the triage vital signs and the nursing notes.   HISTORY  Chief Complaint Migraine   HPI Phillip [name] is a 37 y.o. male with PMH of migraine HA's to the emergency department for evaluation of migraine headache for the past 2 days.  Sudden onset, maximal intensity headache symptoms.  He states that this is typical of his prior migraine headaches.  He has associated photophobia.  Denies any fevers or chills.  No weakness or numbness.  He has been trying over-the-counter medications with no relief in symptoms.  No radiation of symptoms or other modifying factors.  He states that the headache was unilateral yesterday but today has become more diffuse.  He did have some nausea yesterday but that seems to have resolved.   Past Medical History:  Diagnosis Date  . Migraines   . Pneumothorax     There are no active problems to display for this patient.   History reviewed. No pertinent surgical history.   Allergies Patient has no known allergies.  History reviewed. No pertinent family history.  Social History Social History   Tobacco Use  . Smoking status: Current Every Day Smoker    Types: Cigarettes  Substance Use Topics  . Alcohol use: No  . Drug use: Not on file    Review of Systems  Constitutional: No fever/chills Eyes: No visual changes. Positive photophobia.  ENT: No sore throat. Cardiovascular: Denies chest pain. Respiratory: Denies shortness of breath. Gastrointestinal: No abdominal pain.  No nausea, no vomiting.  No diarrhea.  No constipation. Genitourinary: Negative for dysuria. Musculoskeletal: Negative for back pain. Skin: Negative for rash. Neurological: Negative for focal weakness or numbness. Positive HA.   10-point ROS otherwise negative.  ____________________________________________   PHYSICAL EXAM:  VITAL SIGNS: ED Triage Vitals  Enc Vitals Group     BP 05/09/18 0628 (!) 131/103    Pulse Rate 05/09/18 0628 73     Resp 05/09/18 0628 17     Temp 05/09/18 0628 98.1 F (36.7 C)     Temp Source 05/09/18 0628 Oral     SpO2 05/09/18 0628 100 %     Weight 05/09/18 0629 160 lb (72.6 kg)     Height 05/09/18 0629 6' (1.829 m)     Pain Score 05/09/18 0628 7   Constitutional: Alert and oriented. Well appearing and in no acute distress. Sitting in a dark room on my evaluation.  Eyes: Conjunctivae are normal. PERRL Head: Atraumatic. Nose: No congestion/rhinnorhea. Mouth/Throat: Mucous membranes are moist.  Oropharynx non-erythematous. Neck: No stridor.  Cardiovascular: Normal rate, regular rhythm.  Respiratory: Normal respiratory effort.  Gastrointestinal: No distention.  Musculoskeletal: No lower extremity tenderness nor edema. No gross deformities of extremities. Neurologic:  Normal speech and language. No gross focal neurologic deficits are appreciated. Normal CN exam 2-12. Skin:  Skin is warm, dry and intact. No rash noted.  ____________________________________________   PROCEDURES  Procedure(s) performed:   Procedures  None ____________________________________________   INITIAL IMPRESSION / ASSESSMENT AND PLAN / ED COURSE  Pertinent labs & imaging results that were available during my care of the patient were reviewed by me and considered in my medical decision making (see chart for details).  Patient presents to the emergency department with migraine headache.  This has been present for the past 2 days.  No concern for subarachnoid hemorrhage clinically.  Patient does not appear to have signs or symptoms to suggest an infectious etiology.  Plan  for pain management in the emergency department and reassess. No neuro deficits.   8:34 AM Patient feeling better after migraine cocktail.  Provided contact information for local PCP and neurologist.  Advised calling the PCP office today to establish care and to call the neurologist if symptoms worsen.  Prescribed 800  mg Motrin to take as needed for additional headache symptoms at home.   At this time, I do not feel there is any life-threatening condition present. I have reviewed and discussed all results (EKG, imaging, lab, urine as appropriate), exam findings with patient. I have reviewed nursing notes and appropriate previous records.  I feel the patient is safe to be discharged home without further emergent workup. Discussed usual and customary return precautions. Patient and family (if present) verbalize understanding and are comfortable with this plan.  Patient will follow-up with their primary care provider. If they do not have a primary care provider, information for follow-up has been provided to them. All questions have been answered.  ____________________________________________  FINAL CLINICAL IMPRESSION(S) / ED DIAGNOSES  Final diagnoses:  Other migraine without status migrainosus, not intractable     MEDICATIONS GIVEN DURING THIS VISIT:  Medications  sodium chloride 0.9 % bolus 500 mL (500 mLs Intravenous New Bag/Given 05/09/18 0806)  ketorolac (TORADOL) 30 MG/ML injection 30 mg (30 mg Intravenous Given 05/09/18 0807)  metoCLOPramide (REGLAN) injection 10 mg (10 mg Intravenous Given 05/09/18 0807)  dexamethasone (DECADRON) injection 8 mg (8 mg Intravenous Given 05/09/18 0807)     NEW OUTPATIENT MEDICATIONS STARTED DURING THIS VISIT:  New Prescriptions   IBUPROFEN (ADVIL,MOTRIN) 800 MG TABLET    Take 1 tablet (800 mg total) by mouth every 8 (eight) hours as needed for headache.    Note:  This document was prepared using Dragon voice recognition software and may include unintentional dictation errors.  Alona Bene, MD Emergency Medicine    Adilenne Ashworth, Arlyss Repress, MD 05/09/18 [phone]

## 2019-09-09 ENCOUNTER — Encounter (HOSPITAL_COMMUNITY): Payer: Self-pay | Admitting: Emergency Medicine

## 2019-09-09 ENCOUNTER — Emergency Department (HOSPITAL_COMMUNITY)
Admission: EM | Admit: 2019-09-09 | Discharge: 2019-09-09 | Disposition: A | Discharge status: Home / Self Care | Payer: BLUE CROSS/BLUE SHIELD | Attending: Emergency Medicine | Admitting: Emergency Medicine

## 2019-09-09 ENCOUNTER — Other Ambulatory Visit: Payer: Self-pay

## 2019-09-09 DIAGNOSIS — Z79899 Other long term (current) drug therapy: Secondary | ICD-10-CM | POA: Insufficient documentation

## 2019-09-09 DIAGNOSIS — F17210 Nicotine dependence, cigarettes, uncomplicated: Secondary | ICD-10-CM | POA: Insufficient documentation

## 2019-09-09 DIAGNOSIS — L0231 Cutaneous abscess of buttock: Secondary | ICD-10-CM | POA: Insufficient documentation

## 2019-09-09 MED ORDER — LIDOCAINE-EPINEPHRINE (PF) 2 %-1:200000 IJ SOLN
10.0000 mL | Freq: Once | INTRAMUSCULAR | Status: DC
  Filled 2019-09-09: qty 10

## 2019-09-09 MED ORDER — POVIDONE-IODINE 10 % EX SOLN
CUTANEOUS | Status: DC | PRN
  Filled 2019-09-09: qty 15

## 2019-09-09 MED ORDER — HYDROCODONE-ACETAMINOPHEN 5-325 MG PO TABS: ORAL_TABLET | ORAL | 0 refills | Status: DC

## 2019-09-09 MED ORDER — DOXYCYCLINE HYCLATE 100 MG PO CAPS: 100.0000 mg | ORAL_CAPSULE | Freq: Two times a day (BID) | ORAL | 0 refills | Status: DC

## 2019-09-09 NOTE — ED Provider Notes (Signed)
[facility] EMERGENCY DEPARTMENT Provider Note   CSN: [id number] Arrival date & time: 09/09/19  4332     History Chief Complaint  Patient presents with  . Abscess    Phillip [name] is a 39 y.o. male.  HPI     Phillip [name] is a 39 y.o. male who presents to the Emergency Department complaining of pain, swelling to his left buttock.  Symptoms have been present for 1 week.  He states he had a small "zit" to his buttock that he squeezed and since then he notes having increasing pain and swelling at the site.  Pain has caused difficulty with sitting.  He denies redness, drainage, fever, chills, abdominal pain, pain or swelling to his scrotum and rectal pain or difficulty having BM's  Past Medical History:  Diagnosis Date  . Migraines   . Pneumothorax     There are no problems to display for this patient.   History reviewed. No pertinent surgical history.     History reviewed. No pertinent family history.  Social History   Tobacco Use  . Smoking status: Current Every Day Smoker    Packs/day: 0.50    Types: Cigarettes  . Smokeless tobacco: Never Used  Substance Use Topics  . Alcohol use: No  . Drug use: Not on file    Home Medications Prior to Admission medications   Medication Sig Start Date End Date Taking? Authorizing Provider  Multiple Vitamins-Minerals (MULTIVITAMIN WITH MINERALS) tablet Take 1 tablet by mouth daily.   Yes [provider]  ibuprofen (ADVIL,MOTRIN) 800 MG tablet Take 1 tablet (800 mg total) by mouth every 8 (eight) hours as needed for headache. Patient not taking: Reported on 09/09/2019 05/09/18   Long, Wonda Olds, MD    Allergies    Patient has no known allergies.  Review of Systems   Review of Systems  Constitutional: Negative for chills and fever.  Gastrointestinal: Negative for abdominal pain, blood in stool, nausea and vomiting.  Genitourinary: Negative for dysuria, penile pain, penile swelling, scrotal swelling and  testicular pain.  Musculoskeletal: Negative for back pain.  Skin: Negative for color change.       Localized area of pain, swelling to left buttock  Hematological: Negative for adenopathy.    Physical Exam Updated Vital Signs BP (!) 131/92 (BP Location: Left Arm)   Pulse 92   Temp 98.5 F (36.9 C) (Oral)   Resp 18   Ht 6' (1.829 m)   Wt 71.7 kg   SpO2 100%   BMI 21.43 kg/m   Physical Exam Vitals and nursing note reviewed.  Constitutional:      General: He is not in acute distress.    Appearance: Normal appearance. He is well-developed. He is not ill-appearing.  Cardiovascular:     Rate and Rhythm: Normal rate and regular rhythm.  Pulmonary:     Effort: Pulmonary effort is normal. No respiratory distress.     Breath sounds: Normal breath sounds.  Abdominal:     General: There is no distension.     Palpations: Abdomen is soft.     Tenderness: There is no abdominal tenderness.  Genitourinary:    Rectum: Normal.     Comments: No tenderness or edema of rectum or perineum. Musculoskeletal:        General: Normal range of motion.  Skin:    General: Skin is warm.     Findings: No erythema.     Comments: Localized, 10 cm area of fluctuance to  the left buttock.  No erythema or drainage noted.    Neurological:     General: No focal deficit present.     Mental Status: He is alert.     Sensory: No sensory deficit.     Motor: No weakness or abnormal muscle tone.     ED Results / Procedures / Treatments   Labs (all labs ordered are listed, but only abnormal results are displayed) Labs Reviewed - No data to display  EKG None  Radiology No results found.  Procedures .Marland KitchenIncision and Drainage  Date/Time: 09/09/2019 10:00 AM Performed by: Pauline Aus, PA-C Authorized by: Pauline Aus, PA-C   Consent:    Consent obtained:  Verbal   Consent given by:  Patient   Risks discussed:  Bleeding, incomplete drainage and pain Location:    Type:  Abscess   Size:  10  CM   Location:  Lower extremity   Lower extremity location:  Buttock   Buttock location:  L buttock Pre-procedure details:    Skin preparation:  Betadine Anesthesia (see MAR for exact dosages):    Anesthesia method:  None Procedure type:    Complexity:  Complex Procedure details:    Needle aspiration: no     Incision types:  Single straight   Incision depth:  Subcutaneous   Scalpel blade:  11   Wound management:  Irrigated with saline   Drainage:  Purulent   Drainage amount:  Copious   Packing materials:  1/4 in gauze Post-procedure details:    Patient tolerance of procedure:  Tolerated well, no immediate complications   (including critical care time)    Medications Ordered in ED Medications  povidone-iodine (BETADINE) 10 % external solution (has no administration in time range)  lidocaine-EPINEPHrine (XYLOCAINE W/EPI) 2 %-1:200000 (PF) injection 10 mL (has no administration in time range)    ED Course  I have reviewed the triage vital signs and the nursing notes.  Pertinent labs & imaging results that were available during my care of the patient were reviewed by me and considered in my medical decision making (see chart for details).    MDM Rules/Calculators/A&P                      Patient with large abscess to left buttock.  Abscess does not appear to involve the rectum or scrotum.  He is well-appearing, vital signs reviewed.  Successful I&D with large amount of purulent drainage.  Patient agrees to care plan which includes frequent warm wet soaks or compresses, antibiotics, and close follow-up.  Return precautions discussed.   Final Clinical Impression(s) / ED Diagnoses Final diagnoses:  Abscess of buttock, left    Rx / DC Orders ED Discharge Orders    None       Pauline Aus, PA-C 09/10/19 1301    Mancel Bale, MD 09/12/19 [phone]

## 2019-09-09 NOTE — ED Triage Notes (Signed)
Pt repots abscess to left lower back/ buttock x1 week. Pt reports discomfort with sitting, denies fever, nausea.

## 2019-09-09 NOTE — ED Notes (Signed)
Non stick telfa dressing applied to buttock.

## 2019-09-09 NOTE — Discharge Instructions (Signed)
Frequent warm water soaks or compresses.  The packing will need to be removed in 2 days.  Take the antibiotic as directed until its finished.  Return to the ER if you develop any worsening symptoms such as abdominal pain fever, chills, vomiting or pain to your rectum

## 2020-08-27 ENCOUNTER — Other Ambulatory Visit: Payer: Self-pay

## 2021-05-31 ENCOUNTER — Ambulatory Visit
Admission: EM | Admit: 2021-05-31 | Discharge: 2021-05-31 | Disposition: A | Discharge status: Home / Self Care | Payer: 59 | Attending: Urgent Care | Admitting: Urgent Care

## 2021-05-31 ENCOUNTER — Encounter: Payer: Self-pay | Admitting: Emergency Medicine

## 2021-05-31 ENCOUNTER — Other Ambulatory Visit: Payer: Self-pay

## 2021-05-31 DIAGNOSIS — R051 Acute cough: Secondary | ICD-10-CM

## 2021-05-31 DIAGNOSIS — B349 Viral infection, unspecified: Secondary | ICD-10-CM | POA: Diagnosis not present

## 2021-05-31 DIAGNOSIS — R0981 Nasal congestion: Secondary | ICD-10-CM

## 2021-05-31 DIAGNOSIS — R509 Fever, unspecified: Secondary | ICD-10-CM

## 2021-05-31 DIAGNOSIS — R112 Nausea with vomiting, unspecified: Secondary | ICD-10-CM

## 2021-05-31 MED ORDER — CETIRIZINE HCL 10 MG PO TABS: 10.0000 mg | ORAL_TABLET | Freq: Every day | ORAL | 0 refills | Status: DC

## 2021-05-31 MED ORDER — BENZONATATE 100 MG PO CAPS: 100.0000 mg | ORAL_CAPSULE | Freq: Three times a day (TID) | ORAL | 0 refills | Status: DC | PRN

## 2021-05-31 MED ORDER — PROMETHAZINE-DM 6.25-15 MG/5ML PO SYRP: 5.0000 mL | ORAL_SOLUTION | Freq: Every evening | ORAL | 0 refills | Status: DC | PRN

## 2021-05-31 MED ORDER — PSEUDOEPHEDRINE HCL 30 MG PO TABS: 30.0000 mg | ORAL_TABLET | Freq: Three times a day (TID) | ORAL | 0 refills | Status: DC | PRN

## 2021-05-31 NOTE — Discharge Instructions (Signed)
We will notify you of your COVID-19 and influenza test results as they arrive and may take between 48-72 hours.  I encourage you to sign up for MyChart if you have not already done so as this can be the easiest way for [place] to communicate results to you online or through a phone app.  Generally, we only contact you if it is a positive COVID result.  In the meantime, if you develop worsening symptoms including fever, chest pain, shortness of breath despite our current treatment plan then please report to the emergency room as this may be a sign of worsening status from possible COVID-19 infection.  Otherwise, we will manage this as a viral syndrome. For sore throat or cough try using a honey-based tea. Use 3 teaspoons of honey with juice squeezed from half lemon. Place shaved pieces of ginger into 1/2-1 cup of water and warm over stove top. Then mix the ingredients and repeat every 4 hours as needed. Please take Tylenol 500mg -650mg  every 6 hours for aches and pains, fevers. Hydrate very well with at least 2 liters of water. Eat light meals such as soups to replenish electrolytes and soft fruits, veggies. Start an antihistamine like Zyrtec for postnasal drainage, sinus congestion.  You can take this together with pseudoephedrine (Sudafed) at a dose of 30 mg 2-3 times a day as needed for the same kind of congestion.

## 2021-05-31 NOTE — ED Provider Notes (Signed)
[facility]   MRN: [id number] DOB: 01-Oct-1980  Subjective:   Phillip [name] is a 40 y.o. male presenting for 3-day history of acute onset coughing, congestion.  Initially symptoms started out with fatigue, diarrhea and like vomiting but that has improved.  Denies chest pain, shortness of breath, body aches, hematemesis, bloody stools.  Needs a note for work.  No recent antibiotic use, long distance travel, hospitalizations.  No history of GI issues.  Denies taking chronic medications.  No Known Allergies  Past Medical History:  Diagnosis Date   Migraines    Pneumothorax      History reviewed. No pertinent surgical history.  History reviewed. No pertinent family history.  Social History   Tobacco Use   Smoking status: Every Day    Packs/day: 0.50    Types: Cigarettes   Smokeless tobacco: Never  Substance Use Topics   Alcohol use: No    ROS   Objective:   Vitals: BP (!) 153/105   Pulse (!) 101   Temp 97.9 F (36.6 C) (Oral)   Resp 18   SpO2 100%   Physical Exam Constitutional:      General: He is not in acute distress.    Appearance: Normal appearance. He is well-developed and normal weight. He is not ill-appearing, toxic-appearing or diaphoretic.  HENT:     Head: Normocephalic and atraumatic.     Right Ear: Tympanic membrane, ear canal and external ear normal. There is no impacted cerumen.     Left Ear: Tympanic membrane, ear canal and external ear normal. There is no impacted cerumen.     Nose: Nose normal. No congestion or rhinorrhea.     Mouth/Throat:     Mouth: Mucous membranes are moist.     Pharynx: Oropharynx is clear. No oropharyngeal exudate or posterior oropharyngeal erythema.  Eyes:     General: No scleral icterus.       Right eye: No discharge.        Left eye: No discharge.     Extraocular Movements: Extraocular movements intact.     Conjunctiva/sclera: Conjunctivae normal.     Pupils: Pupils are equal, round, and  reactive to light.  Cardiovascular:     Rate and Rhythm: Normal rate and regular rhythm.     Heart sounds: Normal heart sounds. No murmur heard.   No friction rub. No gallop.  Pulmonary:     Effort: Pulmonary effort is normal. No respiratory distress.     Breath sounds: Normal breath sounds. No stridor. No wheezing, rhonchi or rales.  Abdominal:     General: Bowel sounds are normal. There is no distension.     Palpations: Abdomen is soft. There is no mass.     Tenderness: There is no abdominal tenderness. There is no right CVA tenderness, left CVA tenderness, guarding or rebound.  Musculoskeletal:     Cervical back: Normal range of motion and neck supple. No rigidity. No muscular tenderness.  Skin:    General: Skin is warm and dry.  Neurological:     General: No focal deficit present.     Mental Status: He is alert and oriented to person, place, and time.  Psychiatric:        Mood and Affect: Mood normal.        Behavior: Behavior normal.        Thought Content: Thought content normal.        Judgment: Judgment normal.    Assessment and Plan :  PDMP not reviewed this encounter.  1. Viral syndrome   2. Acute cough   3. Nausea vomiting and diarrhea   4. Fever, unspecified   5. Nasal congestion    Will manage for viral illness such as viral URI, viral syndrome, viral rhinitis, COVID-19, viral pharyngitis. Counseled patient on nature of COVID-19 including modes of transmission, diagnostic testing, management and supportive care.  Offered scripts for symptomatic relief. COVID 19 and influenza testing are pending. Deferred imaging given clear cardiopulmonary exam, hemodynamically stable vital signs. Counseled patient on potential for adverse effects with medications prescribed/recommended today, ER and return-to-clinic precautions discussed, patient verbalized understanding.     Wallis Bamberg, PA-C 05/31/21 1203

## 2021-05-31 NOTE — ED Triage Notes (Signed)
Pt is present today with cough, congestion, diarrhea, vomiting, and fatigue. Pt states sx started Friday

## 2021-06-02 LAB — COVID-19, FLU A+B NAA
Influenza A, NAA: NOT DETECTED
Influenza B, NAA: NOT DETECTED
SARS-CoV-2, NAA: NOT DETECTED

## 2021-09-06 ENCOUNTER — Ambulatory Visit
Admission: EM | Admit: 2021-09-06 | Discharge: 2021-09-06 | Disposition: A | Discharge status: Home / Self Care | Payer: 59 | Attending: Urgent Care | Admitting: Urgent Care

## 2021-09-06 ENCOUNTER — Other Ambulatory Visit: Payer: Self-pay

## 2021-09-06 DIAGNOSIS — Z0189 Encounter for other specified special examinations: Secondary | ICD-10-CM

## 2021-09-06 DIAGNOSIS — R197 Diarrhea, unspecified: Secondary | ICD-10-CM

## 2021-09-06 DIAGNOSIS — A084 Viral intestinal infection, unspecified: Secondary | ICD-10-CM

## 2021-09-06 DIAGNOSIS — R112 Nausea with vomiting, unspecified: Secondary | ICD-10-CM

## 2021-09-06 MED ORDER — ONDANSETRON HCL 4 MG/2ML IJ SOLN
4.0000 mg | Freq: Once | INTRAMUSCULAR | Status: AC
  Administered 2021-09-06: 4 mg via INTRAMUSCULAR

## 2021-09-06 MED ORDER — ONDANSETRON 8 MG PO TBDP: 8.0000 mg | ORAL_TABLET | Freq: Three times a day (TID) | ORAL | 0 refills | Status: DC | PRN

## 2021-09-06 MED ORDER — ONDANSETRON 4 MG PO TBDP: 4.0000 mg | ORAL_TABLET | Freq: Once | ORAL | Status: DC

## 2021-09-06 MED ORDER — LOPERAMIDE HCL 2 MG PO CAPS: 2.0000 mg | ORAL_CAPSULE | Freq: Two times a day (BID) | ORAL | 0 refills | Status: DC | PRN

## 2021-09-06 NOTE — Discharge Instructions (Signed)

## 2021-09-06 NOTE — ED Triage Notes (Signed)
Pt reports fever 101.0 F, nausea, diarrhea x 1 day.   Pt requested COVID test to return to work.

## 2021-09-06 NOTE — ED Provider Notes (Signed)
[place]   MRN: [id number] DOB: [birth year]  Subjective:   Phillip [name] is a 41 y.o. male presenting for 1 day history of acute onset fever, nausea, vomiting, diarrhea.  Has had multiple episodes of diarrhea this morning.  Had 2 episodes of vomiting overnight.  Has had persistent nausea and feels like it is difficult for him to eat or drink anything.  He is supposed to get a COVID test for his work.  No history of GI issues.  Patient admits that he eats very unhealthily, eats takeout and fast food multiple times a week.  Thinks that he got food poisoning.  No long distance travel, recent hospitalizations, antibiotic use.  No bloody stools, hematemesis.  No cough, chest pain, shortness of breath.  No marijuana use.  Denies taking chronic medications.    No Known Allergies  Past Medical History:  Diagnosis Date   Migraines    Pneumothorax      History reviewed. No pertinent surgical history.  History reviewed. No pertinent family history.  Social History   Tobacco Use   Smoking status: Every Day    Packs/day: 0.50    Types: Cigarettes   Smokeless tobacco: Never  Substance Use Topics   Alcohol use: Never    ROS   Objective:   Vitals: BP (!) 162/112 (BP Location: Right Arm)    Pulse (!) 104    Temp 98.4 F (36.9 C) (Oral)    Resp 16    SpO2 100%   Physical Exam Constitutional:      General: He is not in acute distress.    Appearance: Normal appearance. He is well-developed and normal weight. He is not ill-appearing, toxic-appearing or diaphoretic.  HENT:     Right Ear: External ear normal.     Left Ear: External ear normal.     Nose: Nose normal.     Mouth/Throat:     Mouth: Mucous membranes are moist.  Eyes:     General: No scleral icterus.       Right eye: No discharge.        Left eye: No discharge.     Extraocular Movements: Extraocular movements intact.     Conjunctiva/sclera: Conjunctivae normal.  Cardiovascular:     Rate and  Rhythm: Normal rate and regular rhythm.     Heart sounds: No murmur heard.   No friction rub. No gallop.  Pulmonary:     Effort: No respiratory distress.     Breath sounds: No wheezing or rales.  Abdominal:     General: There is no distension.     Palpations: Abdomen is soft. There is no mass.     Tenderness: There is no abdominal tenderness. There is no right CVA tenderness, left CVA tenderness, guarding or rebound.     Comments: Hyperactive bowel sounds throughout.  Skin:    General: Skin is warm and dry.  Neurological:     Mental Status: He is alert and oriented to person, place, and time.   IM Zofran given in clinic for his nausea and vomiting.  Assessment and Plan :   PDMP not reviewed this encounter.  1. Viral gastroenteritis   2. Patient requested diagnostic testing   3. Nausea vomiting and diarrhea    COVID test pending. Will manage for suspected viral gastroenteritis with supportive care.  Recommended patient hydrate well, eat light meals and maintain electrolytes.  Will use Zofran and Imodium for nausea, vomiting and diarrhea. Counseled patient on potential for adverse  effects with medications prescribed/recommended today, ER and return-to-clinic precautions discussed, patient verbalized understanding.    Jaynee Eagles, PA-C 09/06/21 1011

## 2021-09-07 LAB — COVID-19, FLU A+B NAA
Influenza A, NAA: NOT DETECTED
Influenza B, NAA: NOT DETECTED
SARS-CoV-2, NAA: NOT DETECTED

## 2022-06-29 ENCOUNTER — Encounter (HOSPITAL_COMMUNITY): Payer: Self-pay

## 2022-06-29 ENCOUNTER — Other Ambulatory Visit: Payer: Self-pay

## 2022-06-29 ENCOUNTER — Emergency Department (HOSPITAL_COMMUNITY)
Admission: EM | Admit: 2022-06-29 | Discharge: 2022-06-29 | Disposition: A | Discharge status: Home / Self Care | Payer: 59 | Attending: Emergency Medicine | Admitting: Emergency Medicine

## 2022-06-29 DIAGNOSIS — I10 Essential (primary) hypertension: Secondary | ICD-10-CM | POA: Insufficient documentation

## 2022-06-29 DIAGNOSIS — L0291 Cutaneous abscess, unspecified: Secondary | ICD-10-CM

## 2022-06-29 DIAGNOSIS — Z79899 Other long term (current) drug therapy: Secondary | ICD-10-CM | POA: Insufficient documentation

## 2022-06-29 DIAGNOSIS — L0231 Cutaneous abscess of buttock: Secondary | ICD-10-CM | POA: Insufficient documentation

## 2022-06-29 MED ORDER — POVIDONE-IODINE 10 % EX SOLN
CUTANEOUS | Status: DC | PRN
  Filled 2022-06-29: qty 14.8

## 2022-06-29 MED ORDER — LIDOCAINE HCL (PF) 2 % IJ SOLN: 10.0000 mL | Freq: Once | INTRAMUSCULAR | Status: AC

## 2022-06-29 MED ORDER — AMLODIPINE BESYLATE 5 MG PO TABS: 5.0000 mg | ORAL_TABLET | Freq: Every day | ORAL | 0 refills | Status: DC

## 2022-06-29 MED ORDER — DOXYCYCLINE HYCLATE 100 MG PO CAPS: 100.0000 mg | ORAL_CAPSULE | Freq: Two times a day (BID) | ORAL | 0 refills | Status: AC

## 2022-06-29 MED ORDER — LIDOCAINE HCL (PF) 2 % IJ SOLN
INTRAMUSCULAR | Status: AC
  Administered 2022-06-29: 10 mL
  Filled 2022-06-29: qty 20

## 2022-06-29 NOTE — ED Triage Notes (Signed)
Pain from abscess in left buttock swollen started 2 days ago.

## 2022-06-29 NOTE — ED Provider Notes (Signed)
[facility] EMERGENCY DEPARTMENT Provider Note   CSN: [id number] Arrival date & time: 06/29/22  8413     History  Chief Complaint  Patient presents with   Abscess    Left buttock swollen not open    Phillip [name] is a 41 y.o. male.  The history is provided by the patient.  Abscess Location:  Pelvis Pelvic abscess location:  L buttock Size:  4 cm Abscess quality: fluctuance, painful and warmth   Abscess quality comment:  Pointing, no spontaneous drainage Duration:  2 days Progression:  Worsening Pain details:    Quality:  Sharp   Severity:  Moderate Chronicity:  New Context: not diabetes, not injected drug use and not skin injury   Relieved by:  Nothing Exacerbated by: direct pressure. Ineffective treatments:  None tried Associated symptoms: no fever, no nausea and no vomiting   Risk factors: no hx of MRSA   Risk factors comment:  Reports perirectal abscess several years ago      Home Medications Prior to Admission medications   Medication Sig Start Date End Date Taking? Authorizing Provider  amLODipine (NORVASC) 5 MG tablet Take 1 tablet (5 mg total) by mouth daily. 06/29/22  Yes Maybelline Kolarik, Raynelle Fanning, PA-C  Aspirin-Acetaminophen-Caffeine (GOODY HEADACHE PO) Take 1 Package by mouth daily as needed (headache).   Yes [provider]  doxycycline (VIBRAMYCIN) 100 MG capsule Take 1 capsule (100 mg total) by mouth 2 (two) times daily. 06/29/22  Yes Harla Mensch, Raynelle Fanning, PA-C  Multiple Vitamins-Minerals (MULTIVITAMIN WITH MINERALS) tablet Take 1 tablet by mouth daily.   Yes [provider]      Allergies    Patient has no known allergies.    Review of Systems   Review of Systems  Constitutional:  Negative for chills and fever.  Gastrointestinal:  Negative for nausea and vomiting.  Skin:  Positive for color change.  All other systems reviewed and are negative.   Physical Exam Updated Vital Signs BP (!) 165/121   Pulse 81   Temp 98.3 F (36.8 C) (Oral)    Resp 18   Ht 6' (1.829 m)   Wt 77.1 kg   SpO2 100%   BMI 23.06 kg/m  Physical Exam Constitutional:      General: He is not in acute distress.    Appearance: He is well-developed.  HENT:     Head: Normocephalic.  Cardiovascular:     Rate and Rhythm: Normal rate.  Pulmonary:     Effort: Pulmonary effort is normal.     Breath sounds: No wheezing.  Musculoskeletal:        General: Normal range of motion.     Cervical back: Neck supple.  Skin:    Findings: Abscess present.     Comments: Abscess left upper mid buttock      ED Results / Procedures / Treatments   Labs (all labs ordered are listed, but only abnormal results are displayed) Labs Reviewed - No data to display  EKG None  Radiology No results found.  Procedures Procedures    INCISION AND DRAINAGE Performed by: Burgess Amor Consent: Verbal consent obtained. Risks and benefits: risks, benefits and alternatives were discussed Type: abscess  Body area: left buttock  Anesthesia: local infiltration  Incision was made with a scalpel.  Local anesthetic: lidocaine 2% without epinephrine  Anesthetic total: 4 ml  Complexity: complex Blunt dissection to break up loculations  Drainage: purulent  Drainage amount: copious  Packing material: 1/4 in iodoform gauze  Patient tolerance: Patient  tolerated the procedure well with no immediate complications.    Medications Ordered in ED Medications  povidone-iodine (BETADINE) 10 % external solution ( Topical Given 06/29/22 1057)  lidocaine HCl (PF) (XYLOCAINE) 2 % injection 10 mL (10 mLs Other Given 06/29/22 1057)    ED Course/ Medical Decision Making/ A&P                           Medical Decision Making Patient presenting with a left buttock abscess, it is not at the pilonidal space but is more centrally located on his buttock, also not communicating with or near the anus.  It was flushed copiously after a large amount of purulence was drained.  He was  started on doxycycline, we discussed home care, return precautions, either removing the packing himself in 3 days, approximately 6 cm of packing was used, or returning here to have this removed.  We also discussed his blood pressure.  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  He was given a temporary permit but was told he has to come back in several weeks for blood pressure recheck.  He denies ever being treated for blood pressure in the past.  He has been placed on amlodipine, he was also given resource for obtaining primary care and recheck of his blood pressure, advised within 1 week.  He was given local primary care providers but also given the [facility] for blood pressure recheck probably.  Risk OTC drugs. Prescription drug management.           Final Clinical Impression(s) / ED Diagnoses Final diagnoses:  Abscess  Hypertension, unspecified type    Rx / DC Orders ED Discharge Orders          Ordered    doxycycline (VIBRAMYCIN) 100 MG capsule  2 times daily        06/29/22 1121    amLODipine (NORVASC) 5 MG tablet  Daily        06/29/22 1124              Burgess Amor, PA-C 06/29/22 1757    Bethann Berkshire, MD 07/02/22 [phone]

## 2022-06-29 NOTE — Discharge Instructions (Signed)
Take the entire course of the antibiotics prescribed.  I recommend a warm Epsom salt soak to the site twice daily to help keep it open and draining.  You may remove the packing in 3 days if you are comfortable, otherwise return here and we will do this for you.  Your blood pressure is significantly elevated today.  You are being prescribed a low-dose blood pressure medication.  You are going to need follow-up care for this as I can only prescribe you a 30-day supply.  I have given you some suggestions below for obtaining primary care.   [facility]  [address] [place], [place] [address] [phone]  Services [facility] offers a variety of basic health services.  Services include but are not limited to: Blood pressure checks  Heart rate checks  Blood sugar checks  Urine analysis  Rapid strep tests  Pregnancy tests.  Health education and referrals  People needing more complex services will be directed to a physician online. Using these virtual visits, doctors can evaluate and prescribe medicine and treatments. There will be no medication on-site, though [facility] will help patients fill their prescriptions at little to no cost.   For More information please go to: [email or web address]

## 2022-06-29 NOTE — ED Triage Notes (Signed)
Abscess on left buttock felt it coming about 23 days ago. Pain walking 8 or 9 out of 10 but just standing 6/10. Swollen but not open.

## 2023-04-28 ENCOUNTER — Emergency Department: Payer: 59

## 2023-04-28 ENCOUNTER — Other Ambulatory Visit: Payer: Self-pay

## 2023-04-28 ENCOUNTER — Emergency Department
Admission: EM | Admit: 2023-04-28 | Discharge: 2023-04-28 | Disposition: A | Discharge status: Home / Self Care | Payer: 59 | Attending: Emergency Medicine | Admitting: Emergency Medicine

## 2023-04-28 DIAGNOSIS — I10 Essential (primary) hypertension: Secondary | ICD-10-CM | POA: Insufficient documentation

## 2023-04-28 DIAGNOSIS — R000 Tachycardia, unspecified: Secondary | ICD-10-CM | POA: Diagnosis not present

## 2023-04-28 DIAGNOSIS — R5383 Other fatigue: Secondary | ICD-10-CM | POA: Insufficient documentation

## 2023-04-28 DIAGNOSIS — R531 Weakness: Secondary | ICD-10-CM | POA: Diagnosis present

## 2023-04-28 DIAGNOSIS — J020 Streptococcal pharyngitis: Secondary | ICD-10-CM | POA: Diagnosis not present

## 2023-04-28 DIAGNOSIS — Z20822 Contact with and (suspected) exposure to covid-19: Secondary | ICD-10-CM | POA: Insufficient documentation

## 2023-04-28 DIAGNOSIS — R197 Diarrhea, unspecified: Secondary | ICD-10-CM | POA: Insufficient documentation

## 2023-04-28 LAB — BASIC METABOLIC PANEL
Anion gap: 13 (ref 5–15)
BUN: 14 mg/dL (ref 6–20)
CO2: 25 mmol/L (ref 22–32)
Calcium: 9.7 mg/dL (ref 8.9–10.3)
Chloride: 98 mmol/L (ref 98–111)
Creatinine, Ser: 1.17 mg/dL (ref 0.61–1.24)
GFR, Estimated: >60 mL/min (ref >60)
Glucose, Bld: 116 mg/dL — ABNORMAL HIGH (ref 70–99)
Potassium: 4.1 mmol/L (ref 3.5–5.1)
Sodium: 136 mmol/L (ref 135–145)

## 2023-04-28 LAB — CBC WITH DIFFERENTIAL/PLATELET
Abs Immature Granulocytes: 0.06 10*3/uL (ref 0.00–0.07)
Basophils Absolute: 0.1 10*3/uL (ref 0.0–0.1)
Basophils Relative: 0 %
Eosinophils Absolute: 0.1 10*3/uL (ref 0.0–0.5)
Eosinophils Relative: 1 %
HCT: 45.7 % (ref 39.0–52.0)
Hemoglobin: 15.4 g/dL (ref 13.0–17.0)
Immature Granulocytes: 0 %
Lymphocytes Relative: 17 %
Lymphs Abs: 2.8 10*3/uL (ref 0.7–4.0)
MCH: 28.4 pg (ref 26.0–34.0)
MCHC: 33.7 g/dL (ref 30.0–36.0)
MCV: 84.3 fL (ref 80.0–100.0)
Monocytes Absolute: 1.3 10*3/uL — ABNORMAL HIGH (ref 0.1–1.0)
Monocytes Relative: 8 %
Neutro Abs: 12.2 10*3/uL — ABNORMAL HIGH (ref 1.7–7.7)
Neutrophils Relative %: 74 %
Platelets: 280 10*3/uL (ref 150–400)
RBC: 5.42 MIL/uL (ref 4.22–5.81)
RDW: 13.2 % (ref 11.5–15.5)
WBC: 16.5 10*3/uL — ABNORMAL HIGH (ref 4.0–10.5)
nRBC: 0 % (ref 0.0–0.2)

## 2023-04-28 LAB — GROUP A STREP BY PCR: Group A Strep by PCR: DETECTED — AB

## 2023-04-28 LAB — SARS CORONAVIRUS 2 BY RT PCR: SARS Coronavirus 2 by RT PCR: NEGATIVE

## 2023-04-28 MED ORDER — KETOROLAC TROMETHAMINE 15 MG/ML IJ SOLN
15.0000 mg | Freq: Once | INTRAMUSCULAR | Status: AC
  Administered 2023-04-28: 15 mg via INTRAVENOUS
  Filled 2023-04-28: qty 1

## 2023-04-28 MED ORDER — SODIUM CHLORIDE 0.9 % IV BOLUS
1000.0000 mL | Freq: Once | INTRAVENOUS | Status: AC
  Administered 2023-04-28: 1000 mL via INTRAVENOUS

## 2023-04-28 MED ORDER — ONDANSETRON 4 MG PO TBDP: 4.0000 mg | ORAL_TABLET | Freq: Three times a day (TID) | ORAL | 0 refills | Status: AC | PRN

## 2023-04-28 MED ORDER — IOHEXOL 300 MG/ML  SOLN
75.0000 mL | Freq: Once | INTRAMUSCULAR | Status: AC | PRN
  Administered 2023-04-28: 75 mL via INTRAVENOUS

## 2023-04-28 MED ORDER — AMLODIPINE BESYLATE 5 MG PO TABS: 5.0000 mg | ORAL_TABLET | Freq: Every day | ORAL | 0 refills | Status: AC

## 2023-04-28 MED ORDER — SODIUM CHLORIDE 0.9 % IV SOLN
3.0000 g | Freq: Once | INTRAVENOUS | Status: AC
  Administered 2023-04-28: 3 g via INTRAVENOUS
  Filled 2023-04-28: qty 8

## 2023-04-28 MED ORDER — DEXAMETHASONE SODIUM PHOSPHATE 10 MG/ML IJ SOLN
10.0000 mg | Freq: Once | INTRAMUSCULAR | Status: AC
  Administered 2023-04-28: 10 mg via INTRAVENOUS
  Filled 2023-04-28: qty 1

## 2023-04-28 MED ORDER — AMOXICILLIN-POT CLAVULANATE 875-125 MG PO TABS: 1.0000 | ORAL_TABLET | Freq: Two times a day (BID) | ORAL | 0 refills | Status: AC

## 2023-04-28 NOTE — ED Notes (Signed)
FULL RAINBOW OBTAINED IN TRIAGE (BLUE<RED<LIGHT GREEN<LAV)

## 2023-04-28 NOTE — ED Provider Notes (Signed)
[facility] Provider Note    Event Date/Time   First MD Initiated Contact with Patient 04/28/23 [phone]     (approximate)   History   Weakness and Sore Throat   HPI  Phillip [name] is a 42 y.o. male no significant past medical history presents to the emergency department with sore throat.  Patient states he started having a sore throat last week and then improved.  On Wednesday had return of his sore throat and has been worse since that time.  Sore throat is worse on the right side.  Endorses change in voice.  Multiple episodes of diarrhea that started yesterday.  Generalized weakness and fatigue.  Denies any fever.  Denies nausea or vomiting.  No recent antibiotic use.     Physical Exam   Triage Vital Signs: ED Triage Vitals [04/28/23 0908]  Encounter Vitals Group     BP (!) 159/116     Systolic BP Percentile      Diastolic BP Percentile      Pulse Rate (!) 145     Resp 18     Temp 98.5 F (36.9 C)     Temp Source Oral     SpO2 100 %     Weight 169 lb 15.6 oz (77.1 kg)     Height 6' (1.829 m)     Head Circumference      Peak Flow      Pain Score 10     Pain Loc      Pain Education      Exclude from Growth Chart     Most recent vital signs: Vitals:   04/28/23 0945 04/28/23 1000  BP:    Pulse: (!) 122 (!) 109  Resp:    Temp:    SpO2: 97% 97%    Physical Exam Constitutional:      Appearance: He is well-developed.  HENT:     Head:     Comments: Change in voice    Mouth/Throat:     Pharynx: Pharyngeal swelling, posterior oropharyngeal erythema and uvula swelling present.     Tonsils: 3+ on the right.     Comments: Difficult to fully evaluate posterior oropharyngeal -worsening swelling to the right side with uvular deviation Eyes:     Conjunctiva/sclera: Conjunctivae normal.  Cardiovascular:     Rate and Rhythm: Regular rhythm.  Pulmonary:     Effort: No respiratory distress.  Musculoskeletal:     Cervical back: Normal range of  motion.  Skin:    General: Skin is warm.  Neurological:     Mental Status: He is alert. Mental status is at baseline.     IMPRESSION / MDM / ASSESSMENT AND PLAN / ED COURSE  I reviewed the triage vital signs and the nursing notes.  Differential diagnosis including PTA, RPA, strep pharyngitis, mono, malignancy  EKG  I, Corena Herter, the attending physician, personally viewed and interpreted this ECG. EKG showed sinus tachycardia with a heart rate of 137.  Normal intervals.  Signs of atrial enlargement.  Sinus tachycardia while on cardiac telemetry.  RADIOLOGY I independently reviewed imaging, my interpretation of imaging: CT soft tissue neck -no signs of an abscess.  Findings concerning for tonsillitis.  LABS (all labs ordered are listed, but only abnormal results are displayed) Labs interpreted as -    Labs Reviewed  GROUP A STREP BY PCR - Abnormal; Notable for the following components:      Result Value   Group A Strep  by PCR DETECTED (*)    All other components within normal limits  CBC WITH DIFFERENTIAL/PLATELET - Abnormal; Notable for the following components:   WBC 16.5 (*)    Neutro Abs 12.2 (*)    Monocytes Absolute 1.3 (*)    All other components within normal limits  BASIC METABOLIC PANEL - Abnormal; Notable for the following components:   Glucose, Bld 116 (*)    All other components within normal limits  SARS CORONAVIRUS 2 BY RT PCR     MDM  On arrival afebrile, significantly tachycardic to 145, hemodynamically stable.  Hypertensive with a blood pressure of 150/116.  Treated with IV fluids.  Given IV Decadron and Toradol for pain control.  Plan for CT soft tissue neck with contrast to further evaluate for abscess  Strep testing is positive, given IV Unasyn  CT scan without signs of a PTA or RPA.  Findings concerning for tonsillitis.  Given IV Unasyn and 2 L of IV fluids.  Significant improvement of tachycardia.  Able to tolerate p.o.  Will start the  patient on antibiotics with Augmentin.  Discussed primary care follow-up and return precautions for any worsening symptoms.  On chart review significantly hypertensive during multiple ED visits and significant hypertension in the emergency department today.  Patient will be started on amlodipine for 90 days, discussed at length the importance of taking this medication and treating his hypertension.  Given a referral for primary care physician discussed follow-up.    PROCEDURES:  Critical Care performed: yes  .Critical Care  Performed by: Corena Herter, MD Authorized by: Corena Herter, MD   Critical care provider statement:    Critical care time (minutes):  35   Critical care time was exclusive of:  Separately billable procedures and treating other patients   Critical care was necessary to treat or prevent imminent or life-threatening deterioration of the following conditions: infection, tachycardia, severe dehydration.   Critical care was time spent personally by me on the following activities:  Development of treatment plan with patient or surrogate, discussions with consultants, evaluation of patient's response to treatment, examination of patient, ordering and review of laboratory studies, ordering and review of radiographic studies, ordering and performing treatments and interventions, pulse oximetry, re-evaluation of patient's condition and review of old charts   I assumed direction of critical care for this patient from another provider in my specialty: no     Patient's presentation is most consistent with acute presentation with potential threat to life or bodily function.   MEDICATIONS ORDERED IN ED: Medications  sodium chloride 0.9 % bolus 1,000 mL (has no administration in time range)  sodium chloride 0.9 % bolus 1,000 mL (1,000 mLs Intravenous New Bag/Given 04/28/23 1004)  dexamethasone (DECADRON) injection 10 mg (10 mg Intravenous Given 04/28/23 1007)  ketorolac (TORADOL) 15  MG/ML injection 15 mg (15 mg Intravenous Given 04/28/23 1009)  Ampicillin-Sulbactam (UNASYN) 3 g in sodium chloride 0.9 % 100 mL IVPB (3 g Intravenous New Bag/Given 04/28/23 1012)  iohexol (OMNIPAQUE) 300 MG/ML solution 75 mL (75 mLs Intravenous Contrast Given 04/28/23 0952)    FINAL CLINICAL IMPRESSION(S) / ED DIAGNOSES   Final diagnoses:  Strep pharyngitis  Uncontrolled hypertension     Rx / DC Orders   ED Discharge Orders          Ordered    Ambulatory Referral to Primary Care (Establish Care)        04/28/23 1050    amLODipine (NORVASC) 5 MG tablet  Daily  04/28/23 1050    amoxicillin-clavulanate (AUGMENTIN) 875-125 MG tablet  2 times daily        04/28/23 1050    ondansetron (ZOFRAN-ODT) 4 MG disintegrating tablet  Every 8 hours PRN        04/28/23 1050             Note:  This document was prepared using Dragon voice recognition software and may include unintentional dictation errors.   Corena Herter, MD 04/28/23 1054

## 2023-04-28 NOTE — Discharge Instructions (Addendum)
You are seen in the emergency department for sore throat.  You tested positive for strep throat.  You had a CT scan that did not show an abscess.  You were significantly dehydrated and given multiple liters of IV fluids.  You are given your first dose of IV antibiotics in the emergency department.  Take your antibiotic tonight as prescribed.  Stay hydrated and drink plenty of fluids.  Return to the emergency department if you have any worsening symptoms.  You are given information to follow-up with your primary care provider.  Your blood pressure has been significantly elevated during multiple visits to the emergency department.  Concern for uncontrolled hypertension.  You were started on amlodipine, it is important that you take this medication every day until you can follow-up with your primary care physician for reevaluation.  Pain control:  Ibuprofen (motrin/aleve/advil) - You can take 3 tablets (600 mg) every 6 hours as needed for pain/fever.  Acetaminophen (tylenol) - You can take 2 extra strength tablets (1000 mg) every 6 hours as needed for pain/fever.  You can alternate these medications or take them together.  Make sure you eat food/drink water when taking these medications.  Thank you for choosing [place] for your health care, it was my pleasure to care for you today!  Corena Herter, MD

## 2023-04-28 NOTE — ED Triage Notes (Signed)
Pt here weakness x3 days. Pt c/o swollen glands on the right side of his throat. Pt states he has not been able to eat d/t the pain. Pt denies abd pain.
